# Patient Record
Sex: Male | Born: 1955 | Race: White | Hispanic: Yes | Marital: Married | Smoking: Never smoker
Health system: Southern US, Community
[De-identification: ages and names within clinical notes are randomized; demographics above are authoritative.]

## PROBLEM LIST (undated history)

## (undated) DIAGNOSIS — E119 Type 2 diabetes mellitus without complications: Secondary | ICD-10-CM

## (undated) DIAGNOSIS — I10 Essential (primary) hypertension: Secondary | ICD-10-CM

## (undated) HISTORY — DX: Type 2 diabetes mellitus without complications: E11.9

## (undated) HISTORY — DX: Essential (primary) hypertension: I10

---

## 2007-04-19 ENCOUNTER — Ambulatory Visit: Payer: Self-pay | Admitting: Cardiology

## 2007-04-19 ENCOUNTER — Observation Stay (HOSPITAL_COMMUNITY): Admission: EM | Admit: 2007-04-19 | Discharge: 2007-04-21 | Payer: Self-pay | Admitting: Emergency Medicine

## 2009-02-01 IMAGING — CR DG CHEST 2V
2 series · 2 of 2 positions shown · non-contrast
Comparison: None.

CLINICAL DATA: Chest pain. 
 CHEST ? 2 VIEW:

[w chest pa]
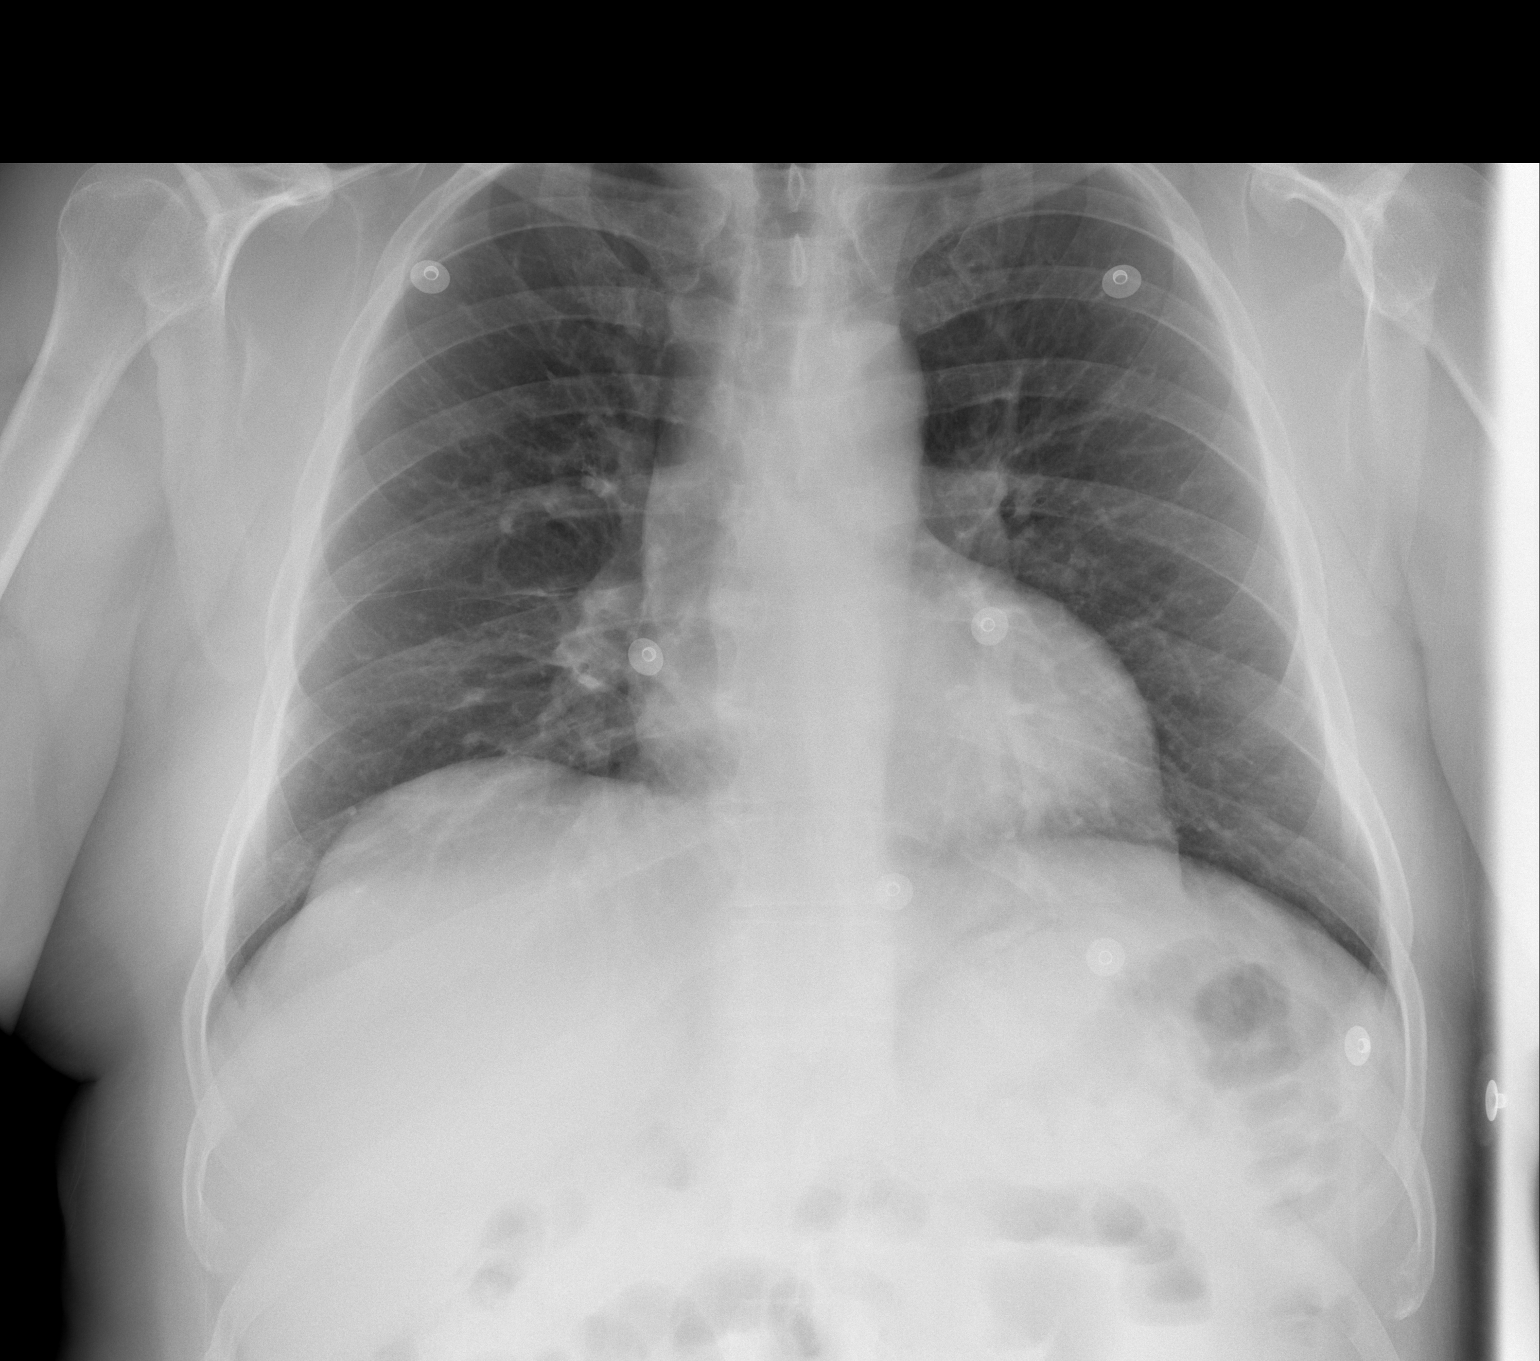

[w chest lat]
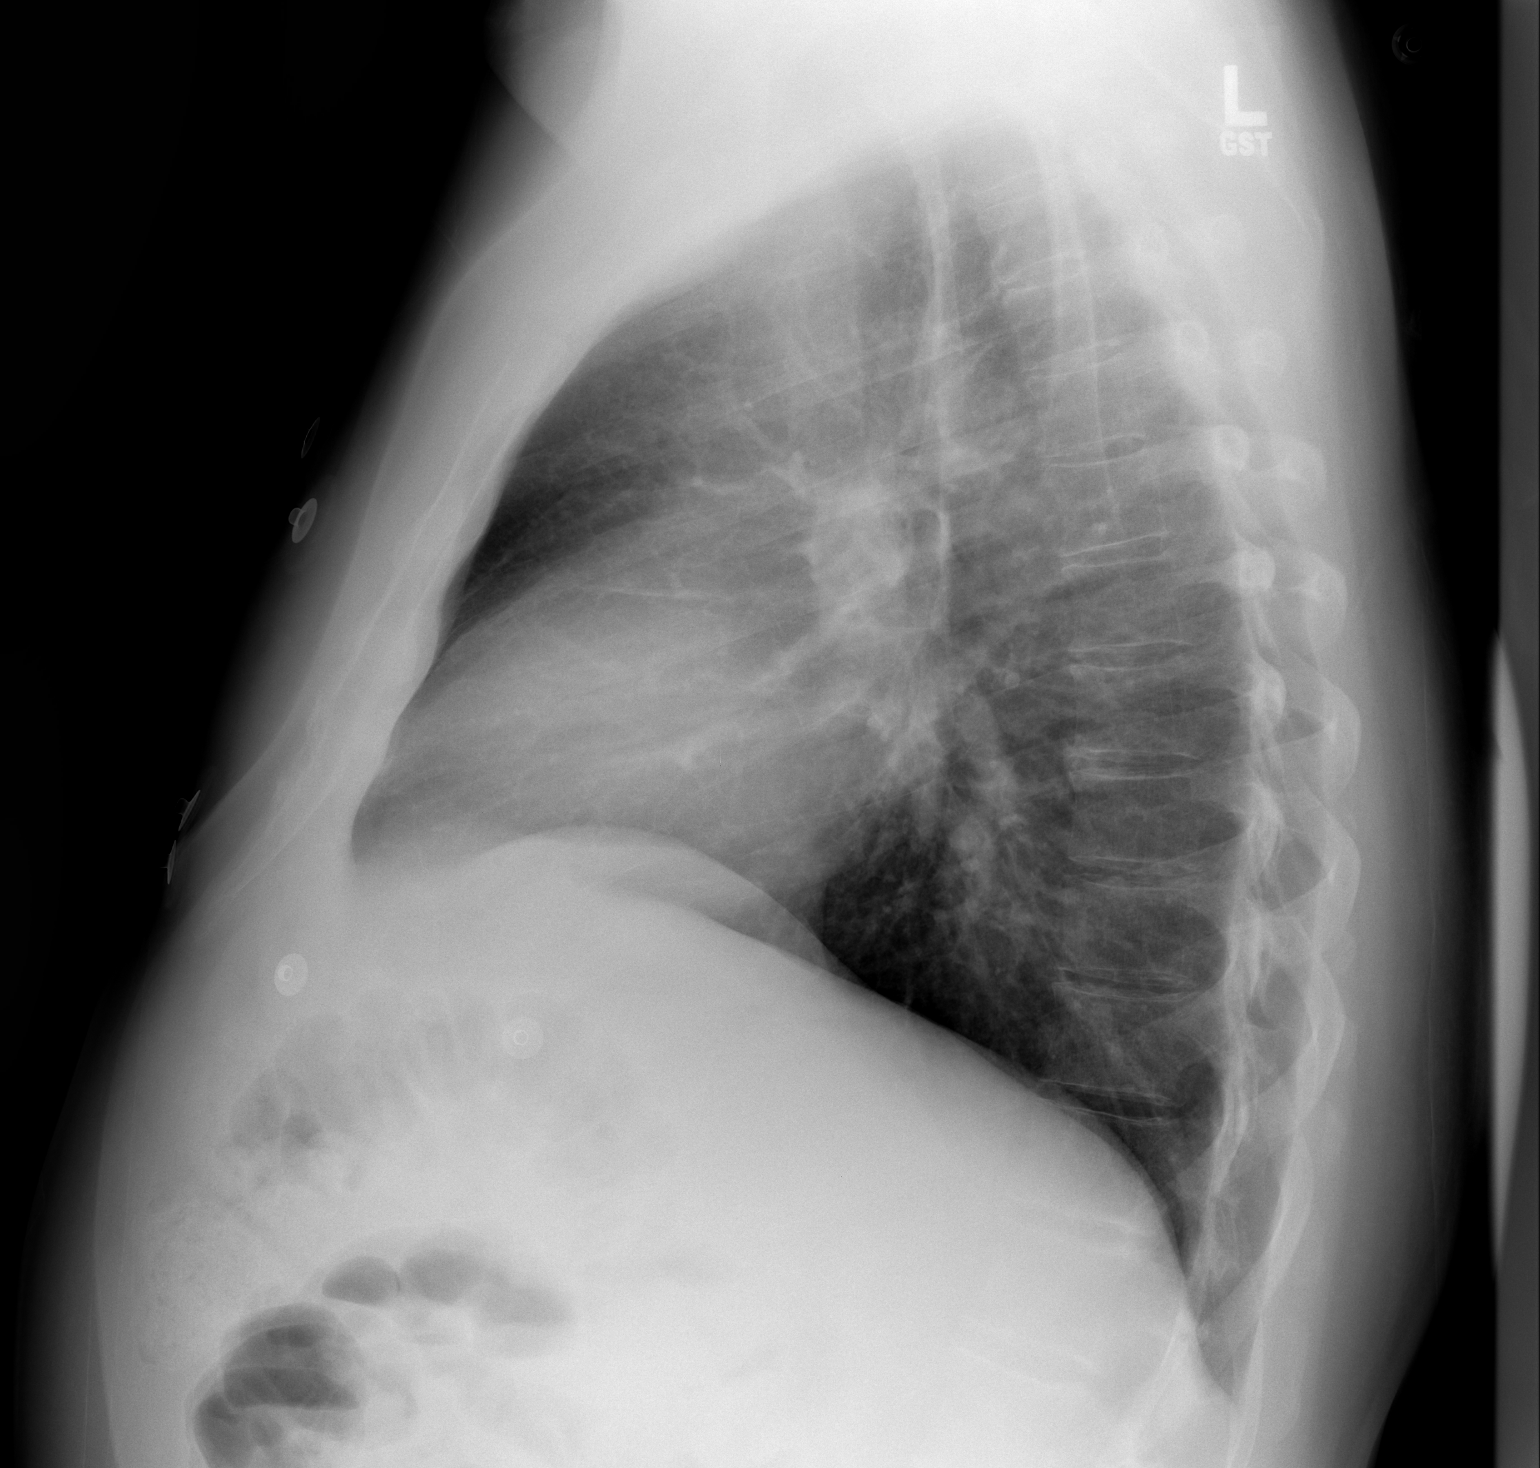

[2 of 2 positions shown; findings below may reference images not displayed]

FINDINGS: Lungs are underinflated with bronchitic changes and bibasilar atelectasis.  No focal consolidation.  The heart is normal in size.  No pneumothoraces or effusions are seen.
IMPRESSION: Bronchitic changes and bibasilar atelectasis.

## 2011-03-16 NOTE — Discharge Summary (Signed)
NAME:  Jonathan Jennings, Jonathan Jennings NO.:  [id number]   MEDICAL RECORD NO.:  [id number]          PATIENT TYPE:  INP   LOCATION:  4713                         FACILITY:  [facility]   PHYSICIAN:  Nicolasa Ducking, ANP DATE OF BIRTH:  [birth year]   DATE OF ADMISSION:  04/18/2007  DATE OF DISCHARGE:  04/20/2007                               DISCHARGE SUMMARY   The patient has no primary care Jonathan Jennings.   REQUESTING PHYSICIAN:  Hettie Holstein, D.O.   [facility] by Dr. Antoine Poche.   PATIENT PROFILE:  A 55 year old Hispanic male with no prior history of  CAD who presented with left shoulder/trapezius pain last night and has  abnormal troponins.   PROBLEM LIST:  1. Left shoulder/trapezius pain.  2. Hypertension diagnosed about 3 years ago.  3. Remote tobacco abuse quitting about 17 years ago and never smoking      more than 2 or 3 cigarettes per day.   HISTORY OF PRESENT ILLNESS:  A 55 year old Hispanic male with no prior  history of CAD.  He was diagnosed with hypertension about 3 years ago  and is being treated with HCTZ.  He has no primary care Jonathan Jennings locally  and obtains his medications from a pharmacy in the [place].  He was in his usual state of health until last evening when he was  sitting at his laptop, he developed 2/10 left trapezius sharp shooting  pain worse with turning on his left side and deep breathing with mild  associated nausea.  He presented to the ED and was treated with  sublingual nitroglycerin with relief of discomfort after about 3 hours  of symptoms.  He admits that he was working out in [employer] and did a new  exercise that sounds like a [facility] and thinks that maybe he had  pulled something.  He was admitted for further evaluation and  subsequently his troponins have bumped to peak of 0.15 at this time.  He  is currently pain free.   ALLERGIES:  No known drug allergies.   HOME MEDICATIONS:  HCTZ 50 mg  daily.   CURRENT MEDICATIONS:  1. Aspirin 81 mg daily.  2. Flexeril 10 mg q.h.s.  3. Enoxaparin 40 mg subcu daily.  4. Protonix 40 mg daily.   FAMILY HISTORY:  Mother is in her 75s with hypertension and diabetes.  Father died of bladder CA at age 25.  He has a brother who has  hypertension, otherwise his siblings are alive and well.   SOCIAL HISTORY:  He lives in [place] with his wife.  He works as a  Medical illustrator.  He used to smoke 2 to 3 cigarettes a day but quit this about  17 years ago, denies any alcohol or drugs.  He does exercise at the gym.   REVIEW OF SYSTEMS:  With the exception of the left trapezius pain and  nausea as outlined in the HPI, all other systems are negative.   PHYSICAL EXAMINATION:  VITAL SIGNS:  Temperature 97.7, heart rate 70,  respirations 18, blood pressure 130/87, pulse  ox 98% on room air.  GENERAL:  Pleasant Hispanic male in no acute distress, awake, alert, and  oriented x3.  NECK:  No bruits or JVD.  LUNGS:  Respirations are unlabored.  Clear to auscultation.  CARDIAC:  Regular, S1, S2.  No S3, S4, or murmurs.  ABDOMEN:  Round, soft, nontender, nondistended.  Bowel sounds present  x4.  EXTREMITIES:  Warm, dry, pink.  No clubbing, cyanosis, or edema.  Dorsalis pedis and tibial pulses 2+ and equal bilaterally.  HEENT:  Normal.  NEURO:  Grossly intact and nonfocal.   Chest x-ray on June 19 shows bronchitic changes and bibasilar  atelectasis.  EKG shows sinus rhythm at a rate of 73.  He has T-wave  inversion in lead III with T flattening and aVF and early  repolarization.  Hemoglobin 14.4, hematocrit 43.3, WBC 9.7, platelets  255.  Sodium 138, potassium 3.7, chloride 104, CO2 of 29, BUN 11,  creatinine 1.04, glucose 120.  Total bilirubin 0.8, alkaline phosphatase  64, AST 20, ALT 45.  Total protein 6.5, albumin 3.4.  CK 69, MB 0.09,  troponin I 0.15, total cholesterol 194, triglycerides 67, HDL 37, LDL  144.   ASSESSMENT AND PLAN:  1. Left  shoulder/trapezius pain, atypical for angina and was      reproducible by patient with positioning and deep breathing.  He is      now pain free.  Despite his atypical presentations, his troponins      have bumped to 0.15 with normal CKs and MBs.  Dr. Antoine Poche has seen      the patient.  We will plan for cardiac CT today and will start beta      blocker now.  Probably will discontinue later today if CT is      negative.  2. Hypertension, stable.  He is on HCTZ at home.  It is recommended      that he obtain a primary care Jonathan Jennings.  3. Hyperlipidemia:  Total cholesterol 194 with an LDL of 144.      Provided that his cardiac evaluation is negative, the patient can      continue with diet and exercise but will need primary care      followup.      Nicolasa Ducking, ANP     CB/MEDQ  D:  04/20/2007  T:  04/20/2007  Job:  [id number]

## 2011-03-16 NOTE — H&P (Signed)
NAME:  Jonathan Jennings, Jonathan Jennings NO.:  [id number]   MEDICAL RECORD NO.:  [id number]          PATIENT TYPE:  EMS   LOCATION:  MAJO                         FACILITY:  [facility]   PHYSICIAN:  Hillery Aldo, M.D.   DATE OF BIRTH:  Jan 16, 1956   DATE OF ADMISSION:  04/19/2007  DATE OF DISCHARGE:                              HISTORY & PHYSICAL   PRIMARY CARE PHYSICIAN:  None.   CHIEF COMPLAINT:  Chest pain.   HISTORY OF PRESENT ILLNESS:  The patient is a 55 year old male who was  in his usual state of health up until this evening, when, while using  his laptop computer, he developed a sudden onset of left neck pain.  This was accompanied by substernal chest pain.  He denied any associated  diaphoresis or shortness of breath.  He denies any cough, fever, or  chills.  He did have some nausea.  When questioned specifically about  this pain, it is moderate in intensity and somewhat sharp.  It has  pleuritic as well as musculoskeletal qualities.  The patient is pointing  to areas in his musculature that have been paining him.  Nevertheless,  he presented to the emergency department with these complaints and had  some relief with sublingual nitroglycerin.  We are therefore admitting  him to 23-hour observation to cycle cardiac enzymes and further risk-  stratify him by checking a fasting lipid panel.   PAST MEDICAL HISTORY:  Mild hypertension.   PAST SURGICAL HISTORY:  None.   FAMILY HISTORY:  The patient's mother is alive in her 12s and has  hypertension and diabetes.  Father died at 49 from cancer of the  bladder.  All his siblings are healthy.  One older brother has  hypertension.  There has not been any family history of early heart  attacks.   SOCIAL HISTORY:  The patient is married.  He works as a Medical illustrator.  He  quit smoking 17 years ago and even while actively smoking, his habits  consisted of 2 cigarettes per day.  He denies any alcohol or drug use.   ALLERGIES:   None.   MEDICATIONS:  Hydrochlorothiazide 25 mg daily.   REVIEW OF SYSTEMS:  The patient's appetite is good.  He has not had any  changes in his bowel habits.  The patient works out in the gym 3-4 times  a week, both aerobically and with weight-training, otherwise as noted in  the elements of the HPI above.   PHYSICAL EXAM:  VITAL SIGNS:  Temperature 97.9, pulse 87, respirations  15, blood pressure 134/79, O2 saturation 95% on room air.  GENERAL:  This is a well-developed, well-nourished male in no acute  distress.  HEENT:  Normocephalic, atraumatic.  PERRL.  EOMI.  Oropharynx is clear.  NECK:  Supple, no thyromegaly, no lymphadenopathy, no jugular venous  distention.  CHEST:  Lungs are clear to auscultation bilaterally with good air  movement.  HEART:  Regular rate and rhythm.  No murmurs, rubs, or gallops.  ABDOMEN:  Soft, nontender, nondistended with normoactive bowel sounds.  Extremities:  No clubbing, edema,  or cyanosis.  Pedal pulses were 2+ and  brisk bilaterally.  SKIN:  Warm and dry.  No rashes.  NEUROLOGIC:  The patient is alert and oriented x3.  Cranial nerves II-  XII are grossly intact.  Moves all extremities x4 with equal strength.  Nonfocal.   DATA REVIEW:  Twelve-lead EKG shows normal sinus rhythm at 88 beats per  minute.  There are T-wave depressions in leads V4 through V6.   Chest x-ray shows bibasilar atelectasis and bronchitic changes.   LABORATORY DATA:  White blood cell count is 9.7, hemoglobin 14.4,  hematocrit 43.3, platelets 255,000.  Sodium is 139, potassium 3.2,  chloride 101, bicarb 28.1, BUN 14, creatinine 1.2, glucose 143.  D-dimer  is less than 0.22.  Myoglobin 58.8, CK-MB less than 1, troponin I less  than 0.05.   ASSESSMENT AND PLAN:  1. Atypical chest pain:  The patient's risk factors for cardiac      disease include male sex, hypertension, and remote tobacco use.      Nevertheless, his pain is very atypical and does not have any       anginal quality.  Given that he does not have a primary care      physician for close followup, we will admit him for 23-hour      observation, cycle cardiac enzymes q.8 h. x3 to rule out an      ischemic event, and check a fasting lipid panel to further risk-      stratify him.  We will start him on low-dose aspirin therapy.      Given that this is likely musculoskeletal in etiology, we will      administer muscle relaxant as needed and pain medications as      needed.  He also has a pleuritic quality to some of his complaints      and bronchitic changes on chest radiography.  Nevertheless, he is      not complaining of cough or other symptoms suggestive of an acute      bronchitis.  We will also put him on Protonix in case that his pain      is reflective of underlying gastroesophageal reflux disease.  2. Hypokalemia:  We will replete the patient's potassium and check a      magnesium level.  3. Hypertension:  We will monitor the patient's blood pressure to      determine if he needs further intervention.  We will continue his      usual dose of hydrochlorothiazide.  4. Prophylaxis:  We will initiate Lovenox for deep venous thrombosis      prophylaxis and Protonix for gastrointestinal prophylaxis.      Hillery Aldo, M.D.  Electronically Signed     CR/MEDQ  D:  04/19/2007  T:  04/19/2007  Job:  [id number]

## 2011-08-18 LAB — COMPREHENSIVE METABOLIC PANEL
AST: 28
Albumin: 3.4 — ABNORMAL LOW
Alkaline Phosphatase: 64
BUN: 14
Chloride: 105
GFR calc Af Amer: >60
Potassium: 3.2 — ABNORMAL LOW
Sodium: 139
Total Protein: 6.4

## 2011-08-18 LAB — CARDIAC PANEL(CRET KIN+CKTOT+MB+TROPI)
CK, MB: 0.8
CK, MB: 0.9
CK, MB: 0.9
Total CK: 84
Troponin I: 0.11 — ABNORMAL HIGH
Troponin I: 0.15 — ABNORMAL HIGH

## 2011-08-18 LAB — I-STAT 8, (EC8 V) (CONVERTED LAB)
BUN: 14
Glucose, Bld: 143 — ABNORMAL HIGH
Hemoglobin: 16.3
Potassium: 3.2 — ABNORMAL LOW
Sodium: 139
TCO2: 30
pH, Ven: 7.39 — ABNORMAL HIGH

## 2011-08-18 LAB — DIFFERENTIAL
Basophils Absolute: 0
Eosinophils Relative: 1
Lymphocytes Relative: 26
Lymphs Abs: 2.5
Monocytes Absolute: 0.7
Monocytes Relative: 7
Neutro Abs: 6.3

## 2011-08-18 LAB — BASIC METABOLIC PANEL
BUN: 11
BUN: 12
Calcium: 8.6
Chloride: 101
Creatinine, Ser: 0.98
Creatinine, Ser: 1.04
GFR calc non Af Amer: >60
Glucose, Bld: 112 — ABNORMAL HIGH
Potassium: 3 — ABNORMAL LOW

## 2011-08-18 LAB — LIPID PANEL
Cholesterol: 194
LDL Cholesterol: 144 — ABNORMAL HIGH
Total CHOL/HDL Ratio: 5.2
Triglycerides: 67

## 2011-08-18 LAB — POCT I-STAT CREATININE: Operator id: 189501

## 2011-08-18 LAB — POCT CARDIAC MARKERS
Myoglobin, poc: 58.8
Operator id: 189501

## 2011-08-18 LAB — CBC
HCT: 43.3
Hemoglobin: 14.4
RBC: 4.75
WBC: 9.7

## 2011-08-18 LAB — TSH: TSH: 2.19

## 2011-08-18 LAB — TROPONIN I: Troponin I: 0.04

## 2011-08-18 LAB — CK TOTAL AND CKMB (NOT AT ARMC)
CK, MB: 1
Total CK: 92

## 2019-04-19 ENCOUNTER — Ambulatory Visit: Payer: Self-pay | Admitting: Primary Care

## 2019-04-26 ENCOUNTER — Other Ambulatory Visit: Payer: Self-pay

## 2019-04-26 ENCOUNTER — Ambulatory Visit: Payer: BLUE CROSS/BLUE SHIELD | Attending: Primary Care | Admitting: Primary Care

## 2019-04-26 ENCOUNTER — Encounter: Payer: Self-pay | Admitting: Primary Care

## 2019-04-26 VITALS — BP 151/89 | HR 76 | Temp 98.6°F | Ht 66.0 in | Wt 187.0 lb

## 2019-04-26 DIAGNOSIS — E119 Type 2 diabetes mellitus without complications: Secondary | ICD-10-CM | POA: Diagnosis not present

## 2019-04-26 DIAGNOSIS — Z7689 Persons encountering health services in other specified circumstances: Secondary | ICD-10-CM

## 2019-04-26 DIAGNOSIS — I10 Essential (primary) hypertension: Secondary | ICD-10-CM | POA: Insufficient documentation

## 2019-04-26 LAB — POCT GLYCOSYLATED HEMOGLOBIN (HGB A1C): Hemoglobin A1C: 8 % — AB (ref 4.0–5.6)

## 2019-04-26 LAB — GLUCOSE, POCT (MANUAL RESULT ENTRY): POC Glucose: 301 mg/dl — AB (ref 70–99)

## 2019-04-26 MED ORDER — JANUMET 50-1000 MG PO TABS: 1.0000 | ORAL_TABLET | Freq: Two times a day (BID) | ORAL | 3 refills | Status: AC

## 2019-04-26 NOTE — Patient Instructions (Signed)
Diabetes mellitus y nutricin, en adultos  Diabetes Mellitus and Nutrition, Adult  Si sufre de diabetes (diabetes mellitus), es muy importante tener hbitos alimenticios saludables debido a que sus niveles de azcar en la sangre (glucosa) se ven afectados en gran medida por lo que come y bebe. Comer alimentos saludables en las cantidades adecuadas, aproximadamente a la misma hora todos los das, lo ayudar a:   Controlar la glucemia.   Disminuir el riesgo de sufrir una enfermedad cardaca.   Mejorar la presin arterial.   Alcanzar o mantener un peso saludable.  Todas las personas que sufren de diabetes son diferentes y cada una tiene necesidades diferentes en cuanto a un plan de alimentacin. El mdico puede recomendarle que trabaje con un especialista en dietas y nutricin (nutricionista) para elaborar el mejor plan para usted. Su plan de alimentacin puede variar segn factores como:   Las caloras que necesita.   Los medicamentos que toma.   Su peso.   Sus niveles de glucemia, presin arterial y colesterol.   Su nivel de actividad.   Otras afecciones que tenga, como enfermedades cardacas o renales.  Cmo me afectan los carbohidratos?  Los carbohidratos, o hidratos de carbono, afectan su nivel de glucemia ms que cualquier otro tipo de alimento. La ingesta de carbohidratos naturalmente aumenta la cantidad de glucosa en la sangre. El recuento de carbohidratos es un mtodo destinado a llevar un registro de la cantidad de carbohidratos que se consumen. El recuento de carbohidratos es importante para mantener la glucemia a un nivel saludable, especialmente si utiliza insulina o toma determinados medicamentos por va oral para la diabetes.  Es importante conocer la cantidad de carbohidratos que se pueden ingerir en cada comida sin correr ningn riesgo. Esto es diferente en cada persona. Su nutricionista puede ayudarlo a calcular la cantidad de carbohidratos que debe ingerir en cada comida y en cada  refrigerio.  Entre los alimentos que contienen carbohidratos, se incluyen:   Pan, cereal, arroz, pastas y galletas.   Papas y maz.   Guisantes, frijoles y lentejas.   Leche y yogur.   Frutas y jugo.   Postres, como pasteles, galletas, helado y caramelos.  Cmo me afecta el alcohol?  El alcohol puede provocar disminuciones sbitas de la glucemia (hipoglucemia), especialmente si utiliza insulina o toma determinados medicamentos por va oral para la diabetes. La hipoglucemia es una afeccin potencialmente mortal. Los sntomas de la hipoglucemia (somnolencia, mareos y confusin) son similares a los sntomas de haber consumido demasiado alcohol.  Si el mdico afirma que el alcohol es seguro para usted, siga estas pautas:   Limite el consumo de alcohol a no ms de 1medida por da si es mujer y no est embarazada, y a 2medidas si es hombre. Una medida equivale a 12oz (355ml) de cerveza, 5oz (148ml) de vino o 1oz (44ml) de bebidas alcohlicas de alta graduacin.   No beba con el estmago vaco.   Mantngase hidratado bebiendo agua, refrescos dietticos o t helado sin azcar.   Tenga en cuenta que los refrescos comunes, los jugos y otras bebida para mezclar pueden contener mucha azcar y se deben contar como carbohidratos.  Cules son algunos consejos para seguir este plan?    Leer las etiquetas de los alimentos   Comience por leer el tamao de la porcin en la "Informacin nutricional" en las etiquetas de los alimentos envasados y las bebidas. La cantidad de caloras, carbohidratos, grasas y otros nutrientes mencionados en la etiqueta se basan en una porcin del alimento.   Muchos alimentos contienen ms de una porcin por envase.   Verifique la cantidad total de gramos (g) de carbohidratos totales en una porcin. Puede calcular la cantidad de porciones de carbohidratos al dividir el total de carbohidratos por 15. Por ejemplo, si un alimento tiene un total de 30g de carbohidratos, equivale a 2  porciones de carbohidratos.   Verifique la cantidad de gramos (g) de grasas saturadas y grasas trans en una porcin. Escoja alimentos que no contengan grasa o que tengan un bajo contenido.   Verifique la cantidad de miligramos (mg) de sal (sodio) en una porcin. La mayora de las personas deben limitar la ingesta de sodio total a menos de 2300mg por da.   Siempre consulte la informacin nutricional de los alimentos etiquetados como "con bajo contenido de grasa" o "sin grasa". Estos alimentos pueden tener un mayor contenido de azcar agregada o carbohidratos refinados, y deben evitarse.   Hable con su nutricionista para identificar sus objetivos diarios en cuanto a los nutrientes mencionados en la etiqueta.  Al ir de compras   Evite comprar alimentos procesados, enlatados o precocinados. Estos alimentos tienden a tener una mayor cantidad de grasa, sodio y azcar agregada.   Compre en la zona exterior de la tienda de comestibles. Esta zona incluye frutas y verduras frescas, granos a granel, carnes frescas y productos lcteos frescos.  Al cocinar   Utilice mtodos de coccin a baja temperatura, como hornear, en lugar de mtodos de coccin a alta temperatura, como frer en abundante aceite.   Cocine con aceites saludables, como el aceite de oliva, canola o girasol.   Evite cocinar con manteca, crema o carnes con alto contenido de grasa.  Planificacin de las comidas   Coma las comidas y los refrigerios regularmente, preferentemente a la misma hora todos los das. Evite pasar largos perodos de tiempo sin comer.   Consuma alimentos ricos en fibra, como frutas frescas, verduras, frijoles y cereales integrales. Consulte a su nutricionista sobre cuntas porciones de carbohidratos puede consumir en cada comida.   Consuma entre 4 y 6 onzas (oz) de protenas magras por da, como carnes magras, pollo, pescado, huevos o tofu. Una onza de protena magra equivale a:  ? 1 onza de carne, pollo o  pescado.  ? 1huevo.  ?  taza de tofu.   Coma algunos alimentos por da que contengan grasas saludables, como aguacates, frutos secos, semillas y pescado.  Estilo de vida   Controle su nivel de glucemia con regularidad.   Haga actividad fsica habitualmente como se lo haya indicado el mdico. Esto puede incluir lo siguiente:  ? 150minutos semanales de ejercicio de intensidad moderada o alta. Esto podra incluir caminatas dinmicas, ciclismo o gimnasia acutica.  ? Realizar ejercicios de elongacin y de fortalecimiento, como yoga o levantamiento de pesas, por lo menos 2veces por semana.   Tome los medicamentos como se lo haya indicado el mdico.   No consuma ningn producto que contenga nicotina o tabaco, como cigarrillos y cigarrillos electrnicos. Si necesita ayuda para dejar de fumar, consulte al mdico.   Trabaje con un asesor o instructor en diabetes para identificar estrategias para controlar el estrs y cualquier desafo emocional y social.  Preguntas para hacerle al mdico   Es necesario que consulte a un instructor en el cuidado de la diabetes?   Es necesario que me rena con un nutricionista?   A qu nmero puedo llamar si tengo preguntas?   Cules son los mejores momentos para controlar la glucemia?  Dnde   encontrar ms informacin:   Asociacin Estadounidense de la Diabetes (American Diabetes Association): diabetes.org   Academia de Nutricin y Diettica (Academy of Nutrition and Dietetics): www.eatright.org   Instituto Nacional de la Diabetes y las Enfermedades Digestivas y Renales (National Institute of Diabetes and Digestive and Kidney Diseases, NIH): www.niddk.nih.gov  Resumen   Un plan de alimentacin saludable lo ayudar a controlar la glucemia y mantener un estilo de vida saludable.   Trabajar con un especialista en dietas y nutricin (nutricionista) puede ayudarlo a elaborar el mejor plan de alimentacin para usted.   Tenga en cuenta que los carbohidratos (hidratos de  carbono) y el alcohol tienen efectos inmediatos en sus niveles de glucemia. Es importante contar los carbohidratos que ingiere y consumir alcohol con prudencia.  Esta informacin no tiene como fin reemplazar el consejo del mdico. Asegrese de hacerle al mdico cualquier pregunta que tenga.  Document Released: 01/25/2008 Document Revised: 06/28/2017 Document Reviewed: 02/07/2017  Elsevier Interactive Patient Education  2019 [employer]

## 2019-04-26 NOTE — Progress Notes (Signed)
Established Patient Office Visit  Subjective:  Patient ID: Jonathan Jennings, male    DOB: [birth year]  Age: 63 y.o. MRN: [id number]  CC:  Chief Complaint  Patient presents with  . New Patient (Initial Visit)    HPI Jonathan Jennings presents to establish care and hospital follow-up, he is also requesting medication refills. Denies shortness of breath, headaches, chest pain or lower extremity edema. PMH listed below:  Past Medical History:  Diagnosis Date  . Diabetes mellitus without complication (Jonathan Jennings)   . Hypertension     History reviewed. No pertinent surgical history.  History reviewed. No pertinent family history.  Social History   Socioeconomic History  . Marital status: Married    Spouse name: Not on file  . Number of children: Not on file  . Years of education: Not on file  . Highest education level: Not on file  Occupational History  . Not on file  Social Needs  . Financial resource strain: Not on file  . Food insecurity    Worry: Not on file    Inability: Not on file  . Transportation needs    Medical: Not on file    Non-medical: Not on file  Tobacco Use  . Smoking status: Never Smoker  . Smokeless tobacco: Never Used  Substance and Sexual Activity  . Alcohol use: Never    Frequency: Never  . Drug use: Never  . Sexual activity: Yes    Partners: Female  Lifestyle  . Physical activity    Days per week: Not on file    Minutes per session: Not on file  . Stress: Not on file  Relationships  . Social Herbalist on phone: Not on file    Gets together: Not on file    Attends religious service: Not on file    Active member of club or organization: Not on file    Attends meetings of clubs or organizations: Not on file    Relationship status: Not on file  . Intimate partner violence    Fear of current or ex partner: Not on file    Emotionally abused: Not on file    Physically abused: Not on file    Forced sexual activity: Not on  file  Other Topics Concern  . Not on file  Social History Narrative  . Not on file    Outpatient Medications Prior to Visit  Medication Sig Dispense Refill  . losartan-hydrochlorothiazide (HYZAAR) 100-25 MG tablet Take 1 tablet by mouth daily.      No facility-administered medications prior to visit.     No Known Allergies  ROS Review of Systems  All other systems reviewed and are negative.     Objective:    Physical Exam  Constitutional: He is oriented to person, place, and time. He appears well-developed and well-nourished.  HENT:  Head: Normocephalic.  Neck: Neck supple.  Cardiovascular: Normal rate and regular rhythm.  Pulmonary/Chest: Breath sounds normal.  Abdominal: Soft. Bowel sounds are normal.  Musculoskeletal: Normal range of motion.  Neurological: He is oriented to person, place, and time.  Skin: Skin is warm and dry.  Psychiatric: He has a normal mood and affect.    BP (!) 151/89 (BP Location: Right Arm, Patient Position: Sitting, Cuff Size: Normal)   Pulse 76   Temp 98.6 F (37 C) (Oral)   Ht '5\' 6"'$  (1.676 m)   Wt 187 lb (84.8 kg)   SpO2 97%   BMI 30.18  kg/m  Wt Readings from Last 3 Encounters:  04/26/19 187 lb (84.8 kg)     Health Maintenance Due  Topic Date Due  . Hepatitis C Screening  12/17/1955  . HIV Screening  11/20/1972  . TETANUS/TDAP  11/20/1976  . COLONOSCOPY  11/21/2007    There are no preventive care reminders to display for this patient.  Lab Results  Component Value Date   WBC 9.7 04/19/2007   HGB 14.4 04/19/2007   HCT 43.3 04/19/2007   MCV 91.2 04/19/2007   PLT 255 04/19/2007   Lab Results  Component Value Date   NA 138 04/20/2007   K 3.7 DELTA CHECK NOTED 04/20/2007   CO2 29 04/20/2007   GLUCOSE 120 (H) 04/20/2007   BUN 11 04/20/2007   CREATININE 1.04 04/20/2007   BILITOT 0.8 04/19/2007   ALKPHOS 64 04/19/2007   AST 28 04/19/2007   ALT 45 04/19/2007   PROT 6.4 04/19/2007   ALBUMIN 3.4 (L) 04/19/2007    CALCIUM 8.6 04/20/2007   Lab Results  Component Value Date   CHOL  04/19/2007    194        ATP III CLASSIFICATION:  <200     mg/dL   Desirable  200-239  mg/dL   Borderline High  >=240    mg/dL   High   Lab Results  Component Value Date   HDL 37 (L) 04/19/2007   Lab Results  Component Value Date   LDLCALC (H) 04/19/2007    144        Total Cholesterol/HDL:CHD Risk Coronary Heart Disease Risk Table                     Men   Women  1/2 Average Risk   3.4   3.3   Lab Results  Component Value Date   TRIG 67 04/19/2007   Lab Results  Component Value Date   CHOLHDL 5.2 04/19/2007   Lab Results  Component Value Date   HGBA1C 8.0 (A) 04/26/2019      Assessment & Plan:   Problem List Items Addressed This Visit    Encounter to establish care   Hypertension   Relevant Medications   losartan-hydrochlorothiazide (HYZAAR) 100-25 MG tablet   Other Relevant Orders   CMP14+EGFR    Other Visit Diagnoses    Type 2 diabetes mellitus without complication, without long-term current use of insulin ([facility]CC)    -  Primary   Relevant Medications   losartan-hydrochlorothiazide (HYZAAR) 100-25 MG tablet   sitaGLIPtin-metformin (JANUMET) 50-1000 MG tablet   Other Relevant Orders   HgB A1c (Completed)   Glucose (CBG) (Completed)   CBC with Differential/Platelet   CMP14+EGFR   Lipid panel    Jonathan Jennings was seen today for new patient (initial visit).  Diagnoses and all orders for this visit:  Type 2 diabetes mellitus without complication, without long-term current use of insulin ([place]) ADA recommends the following therapeutic goals for glycemic control related to A1c measurements: Goal of therapy: Less than 6.5 hemoglobin A1c.  Reference clinical practice recommendations. Foods that are high in carbohydrates are the following rice, potatoes, breads, sugars, and pastas.  Reduction in the intake (eating) will assist in lowering your blood sugars. -     HgB A1c -     Glucose (CBG) -     CBC  with Differential/Platelet; Future -     CMP14+EGFR; Future -     Lipid panel; Future  Encounter to establish care No office visit  or PCP for years needs management with healthcare maintenance and Hypertension  Essential hypertension Counseled on blood pressure goal of less than 130/80, low-sodium, DASH diet, medication compliance, 150 minutes of moderate intensity exercise per week. Discussed medication compliance, adverse effects. -     CMP14+EGFR; Future  Other orders -     sitaGLIPtin-metformin (JANUMET) 50-1000 MG tablet; Take 1 tablet by mouth 2 (two) times daily with a meal.    Meds ordered this encounter  Medications  . sitaGLIPtin-metformin (JANUMET) 50-1000 MG tablet    Sig: Take 1 tablet by mouth 2 (two) times daily with a meal.    Dispense:  60 tablet    Refill:  3    Follow-up: Return in about 4 days (around 04/30/2019) for labs [place] [id number] . Follow up for HTN, DM in 3 months.    Kerin Perna, NP

## 2019-04-26 NOTE — Progress Notes (Signed)
Vildagliptina+metformin 50 mg/1000 mg- patient got from [place] Taking BID 1 am 1 pm .

## 2019-04-30 ENCOUNTER — Other Ambulatory Visit (INDEPENDENT_AMBULATORY_CARE_PROVIDER_SITE_OTHER): Payer: BLUE CROSS/BLUE SHIELD

## 2019-07-30 ENCOUNTER — Ambulatory Visit (INDEPENDENT_AMBULATORY_CARE_PROVIDER_SITE_OTHER): Payer: BLUE CROSS/BLUE SHIELD | Admitting: Primary Care
